# Patient Record
Sex: Male | Born: 1940 | Race: White | Hispanic: No | Smoking: Former smoker
Health system: Southern US, Community
[De-identification: ages and names within clinical notes are randomized; demographics above are authoritative.]

## PROBLEM LIST (undated history)

## (undated) DIAGNOSIS — E119 Type 2 diabetes mellitus without complications: Secondary | ICD-10-CM

## (undated) DIAGNOSIS — H518 Other specified disorders of binocular movement: Secondary | ICD-10-CM

## (undated) DIAGNOSIS — F209 Schizophrenia, unspecified: Secondary | ICD-10-CM

## (undated) DIAGNOSIS — I219 Acute myocardial infarction, unspecified: Secondary | ICD-10-CM

## (undated) DIAGNOSIS — F319 Bipolar disorder, unspecified: Secondary | ICD-10-CM

## (undated) HISTORY — PX: CORONARY ANGIOPLASTY WITH STENT PLACEMENT: SHX49

---

## 2016-12-08 ENCOUNTER — Emergency Department (HOSPITAL_COMMUNITY)
Admission: EM | Admit: 2016-12-08 | Discharge: 2016-12-08 | Disposition: A | Discharge status: Home / Self Care | Payer: Non-veteran care | Attending: Emergency Medicine | Admitting: Emergency Medicine

## 2016-12-08 ENCOUNTER — Encounter (HOSPITAL_COMMUNITY): Payer: Self-pay | Admitting: Emergency Medicine

## 2016-12-08 ENCOUNTER — Emergency Department (HOSPITAL_COMMUNITY): Payer: Non-veteran care

## 2016-12-08 DIAGNOSIS — W01198A Fall on same level from slipping, tripping and stumbling with subsequent striking against other object, initial encounter: Secondary | ICD-10-CM | POA: Insufficient documentation

## 2016-12-08 DIAGNOSIS — Y999 Unspecified external cause status: Secondary | ICD-10-CM | POA: Insufficient documentation

## 2016-12-08 DIAGNOSIS — Z87891 Personal history of nicotine dependence: Secondary | ICD-10-CM | POA: Diagnosis not present

## 2016-12-08 DIAGNOSIS — R51 Headache: Secondary | ICD-10-CM | POA: Diagnosis not present

## 2016-12-08 DIAGNOSIS — I252 Old myocardial infarction: Secondary | ICD-10-CM | POA: Insufficient documentation

## 2016-12-08 DIAGNOSIS — Y929 Unspecified place or not applicable: Secondary | ICD-10-CM | POA: Insufficient documentation

## 2016-12-08 DIAGNOSIS — S0083XA Contusion of other part of head, initial encounter: Secondary | ICD-10-CM

## 2016-12-08 DIAGNOSIS — Y9389 Activity, other specified: Secondary | ICD-10-CM | POA: Insufficient documentation

## 2016-12-08 DIAGNOSIS — S0993XA Unspecified injury of face, initial encounter: Secondary | ICD-10-CM | POA: Diagnosis present

## 2016-12-08 DIAGNOSIS — W19XXXA Unspecified fall, initial encounter: Secondary | ICD-10-CM

## 2016-12-08 DIAGNOSIS — E119 Type 2 diabetes mellitus without complications: Secondary | ICD-10-CM | POA: Insufficient documentation

## 2016-12-08 HISTORY — DX: Type 2 diabetes mellitus without complications: E11.9

## 2016-12-08 HISTORY — DX: Other specified disorders of binocular movement: H51.8

## 2016-12-08 HISTORY — DX: Acute myocardial infarction, unspecified: I21.9

## 2016-12-08 HISTORY — DX: Bipolar disorder, unspecified: F31.9

## 2016-12-08 HISTORY — DX: Schizophrenia, unspecified: F20.9

## 2016-12-08 NOTE — ED Notes (Signed)
Pt is back from CT
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
## 2016-12-08 NOTE — Discharge Instructions (Signed)
Head CT without acute findings here. Can follow-up with your primary care doctor once you get back home. Return here for any new concerns.
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
## 2018-06-24 DEATH — deceased

## 2018-09-14 IMAGING — CT CT HEAD W/O CM
4 series · 17 of 47 positions shown, 19 images · non-contrast
Comparison: None.

CLINICAL DATA: Fall to the floor with trauma to the forehead.

EXAM:
CT HEAD WITHOUT CONTRAST
TECHNIQUE: Contiguous axial images were obtained from the base of the skull
through the vertex without intravenous contrast.

[Series 3: head wo · axial · 0.46mm/px · z∈[-119,+16]mm · 7 of 37 slices shown, 9 images]
[im 5/37  brain]
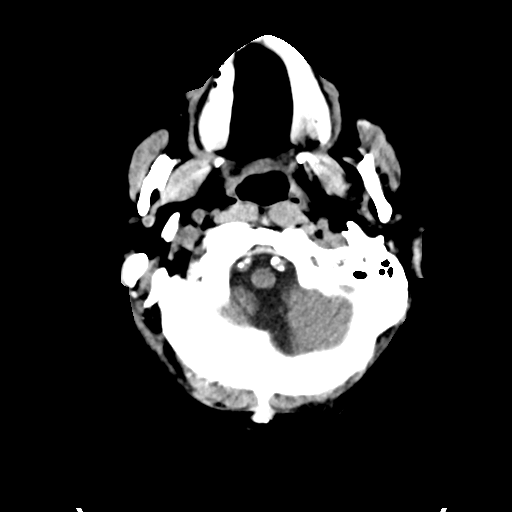
[im 5/37  bone]
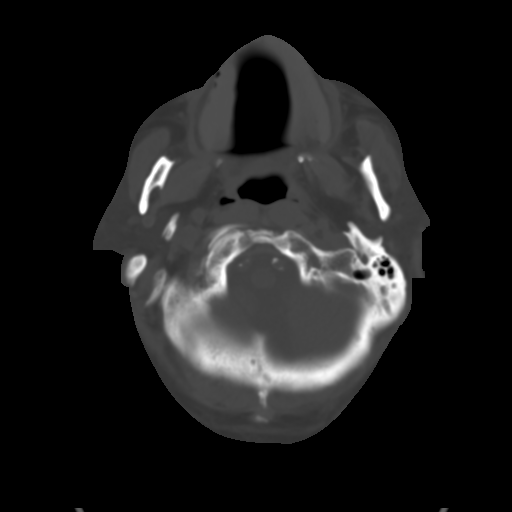
[im 10/37  brain]
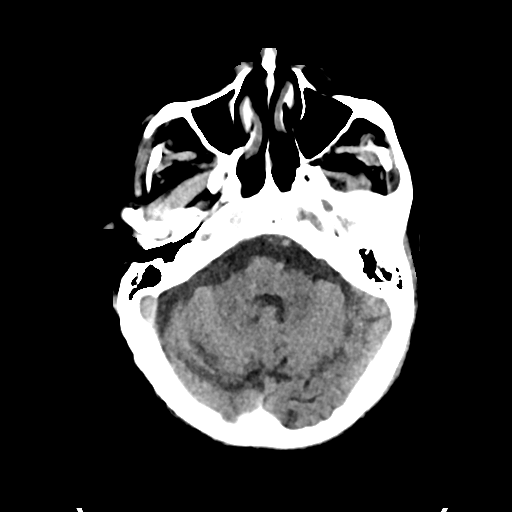
[im 14/37  brain]
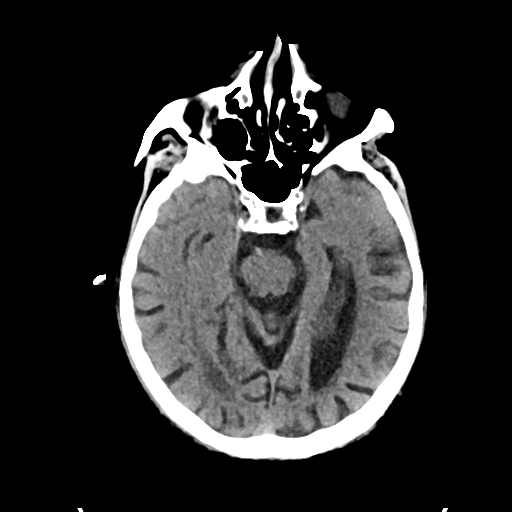
[im 19/37  brain]
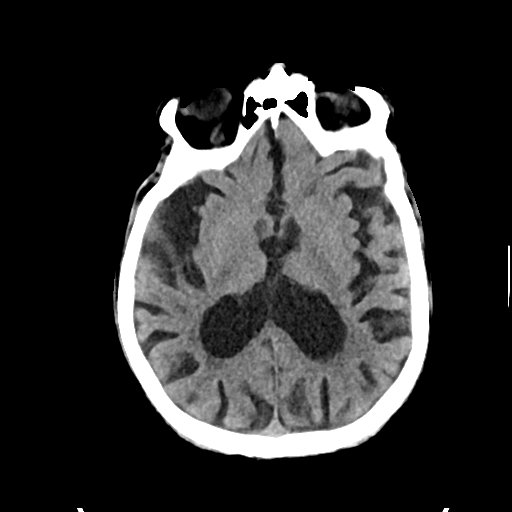
[im 23/37  brain]
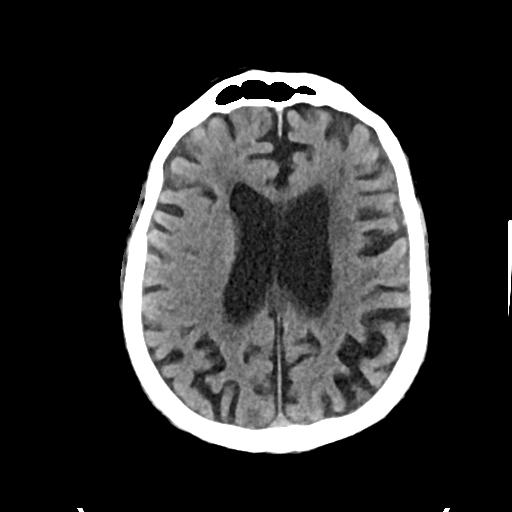
[im 23/37  bone]
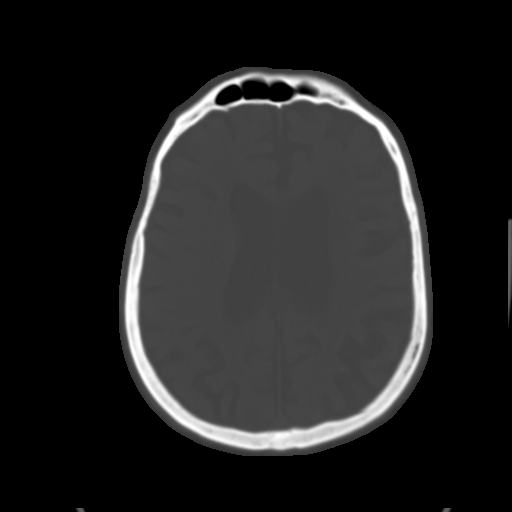
[im 28/37  brain]
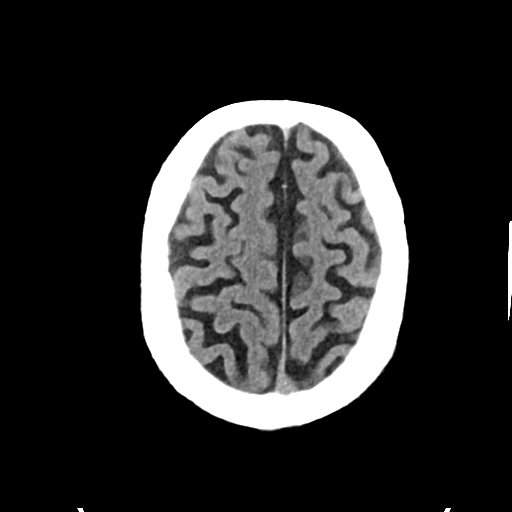
[im 32/37  brain]
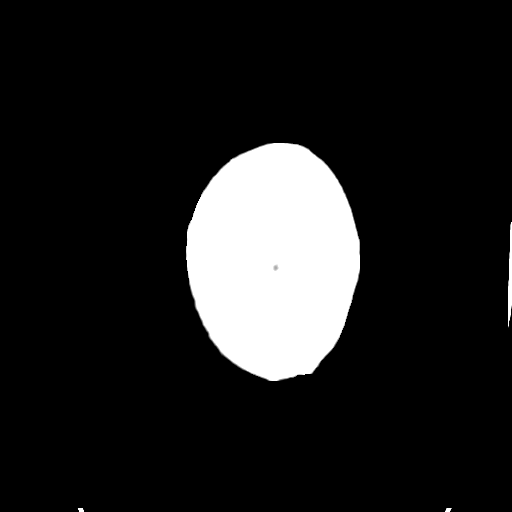

[Series 4: head bone · axial · 0.46mm/px · z∈[-121,-59]mm · 4 of 91 slices shown]
[im 10/91  bone]
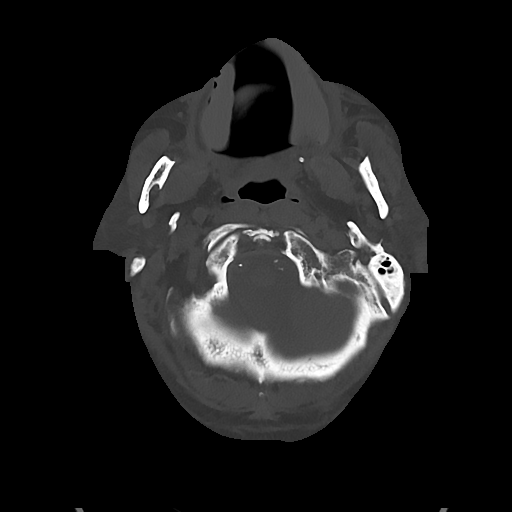
[im 19/91  bone]
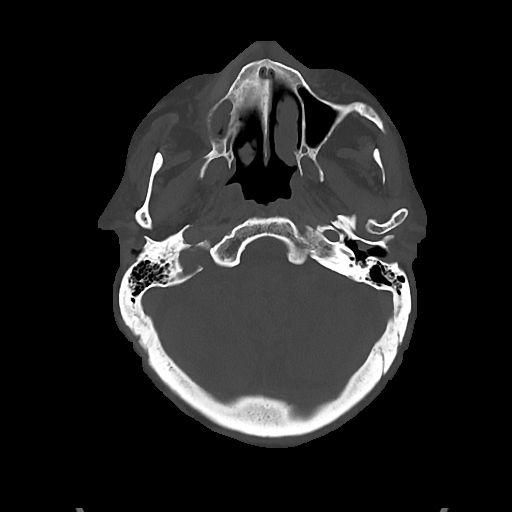
[im 28/91  bone]
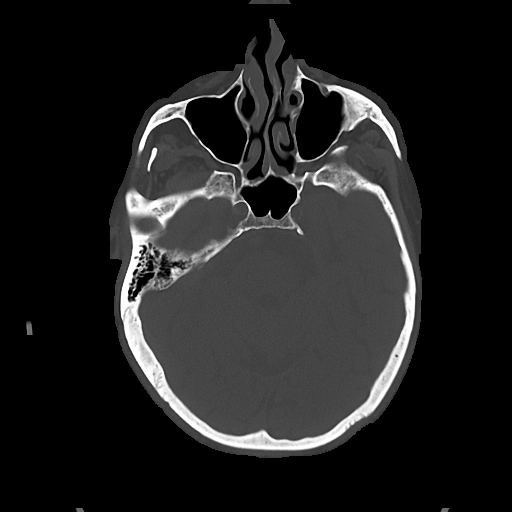
[im 41/91  bone]
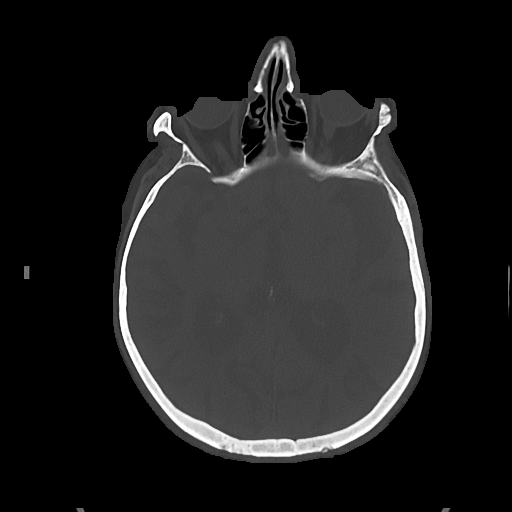

[Series 5: cor soft · coronal · 0.35mm/px · 3 of 70 slices shown]
[im 24/70  brain]
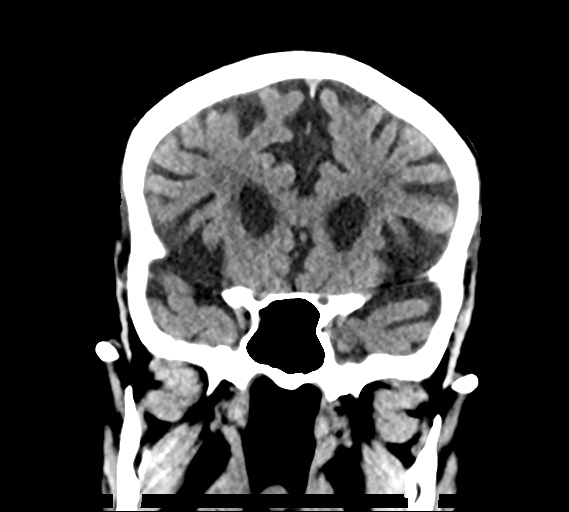
[im 31/70  brain]
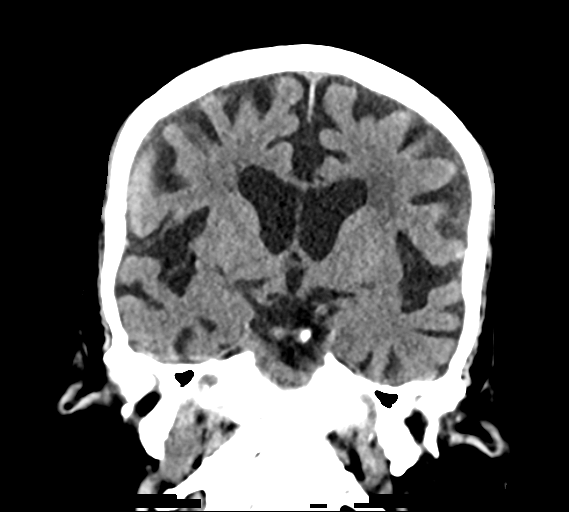
[im 39/70  brain]
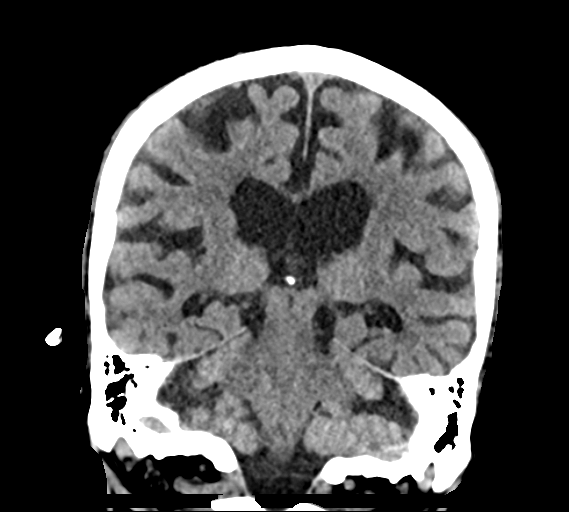

[Series 6: sag soft · sagittal · 0.41mm/px · 3 of 62 slices shown]
[im 21/62  brain]
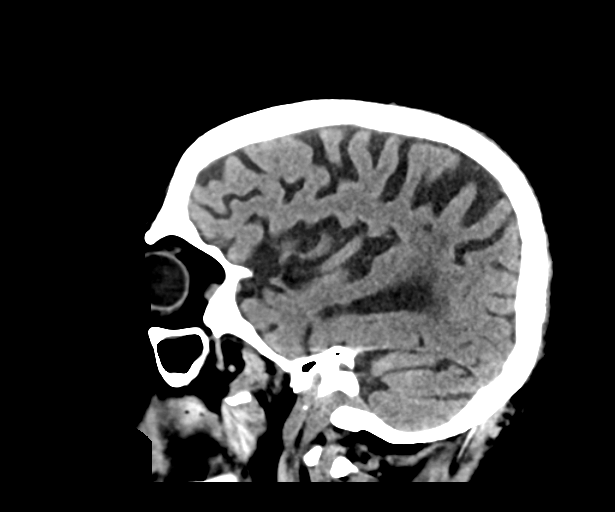
[im 31/62  brain]
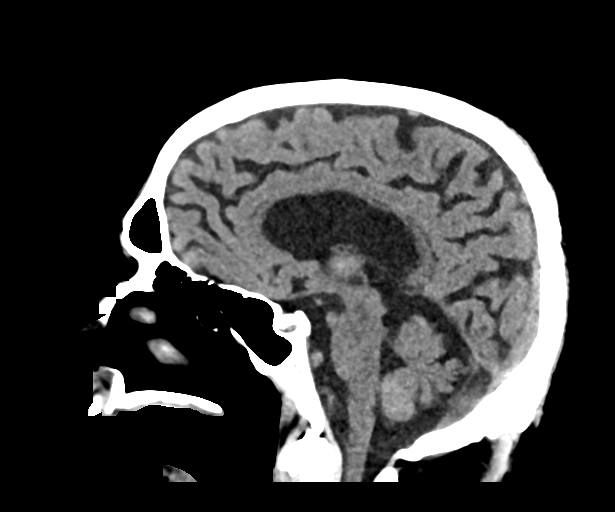
[im 41/62  brain]
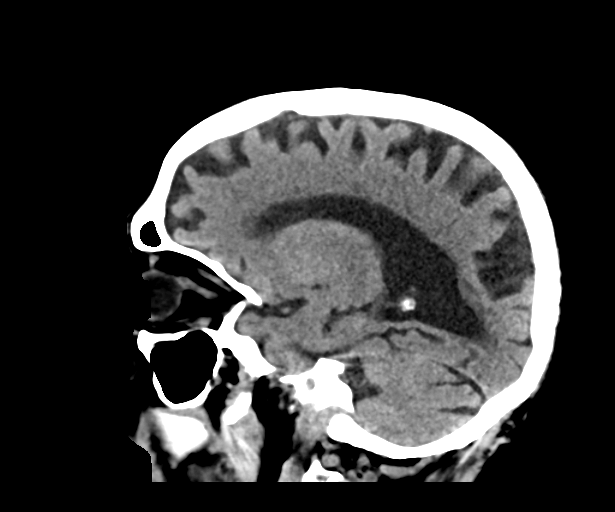

[17 of 47 positions shown; findings below may reference images not displayed]

FINDINGS: Brain: Generalized brain atrophy. Chronic small-vessel ischemic
changes of the pons and hemispheric white matter. No sign of acute
or subacute infarction, mass lesion, hemorrhage, hydrocephalus or
extra-axial collection.

Vascular: There is atherosclerotic calcification of the major
vessels at the base of the brain.

Skull: Negative

Sinuses/Orbits: Clear/normal

Other: Right frontal scalp swelling.
IMPRESSION: No acute intracranial finding. No skull fracture. Scalp swelling.
Brain atrophy and chronic small-vessel ischemic changes.
